# Patient Record
Sex: Male | Born: 1980 | Race: White | Hispanic: No | Marital: Married | Smoking: Never smoker
Health system: Southern US, Community
[De-identification: ages and names within clinical notes are randomized; demographics above are authoritative.]

## PROBLEM LIST (undated history)

## (undated) DIAGNOSIS — I10 Essential (primary) hypertension: Secondary | ICD-10-CM

## (undated) HISTORY — DX: Gilbert syndrome: E80.4

## (undated) HISTORY — DX: Essential (primary) hypertension: I10

---

## 2019-05-09 ENCOUNTER — Ambulatory Visit (INDEPENDENT_AMBULATORY_CARE_PROVIDER_SITE_OTHER): Payer: BC Managed Care – PPO | Admitting: Gastroenterology

## 2019-05-09 ENCOUNTER — Other Ambulatory Visit (INDEPENDENT_AMBULATORY_CARE_PROVIDER_SITE_OTHER): Payer: BC Managed Care – PPO

## 2019-05-09 ENCOUNTER — Other Ambulatory Visit: Payer: Self-pay

## 2019-05-09 ENCOUNTER — Encounter: Payer: Self-pay | Admitting: Gastroenterology

## 2019-05-09 VITALS — BP 130/104 | HR 61 | Temp 98.3°F | Ht 70.5 in | Wt 204.0 lb

## 2019-05-09 DIAGNOSIS — R1900 Intra-abdominal and pelvic swelling, mass and lump, unspecified site: Secondary | ICD-10-CM | POA: Insufficient documentation

## 2019-05-09 DIAGNOSIS — K5909 Other constipation: Secondary | ICD-10-CM | POA: Insufficient documentation

## 2019-05-09 LAB — BASIC METABOLIC PANEL
BUN: 14 mg/dL (ref 6–23)
CO2: 25 mEq/L (ref 19–32)
Calcium: 9.5 mg/dL (ref 8.4–10.5)
Chloride: 101 mEq/L (ref 96–112)
Creatinine, Ser: 1.11 mg/dL (ref 0.40–1.50)
GFR: 73.17 mL/min (ref >60.00)
Glucose, Bld: 91 mg/dL (ref 70–99)
Potassium: 4.2 mEq/L (ref 3.5–5.1)
Sodium: 136 mEq/L (ref 135–145)

## 2019-05-09 NOTE — Progress Notes (Signed)
     05/09/2019 Kenneth Gonzales [id number] [birth year]   HISTORY OF PRESENT ILLNESS: This is a 39 year old male who is new to our office.  He has been referred here by his PCP, Dr. Elnoria Gonzales land, for evaluation regarding a bulge on the left side of his abdomen.  He says that he just happened to notice it one day while riding in the car, it seemed to be bigger than the other side.  He can notice it mostly when he stands and pushes his stomach/abdomen out.  It is not painful.  PCP ordered an abdominal series x-ray, which just showed moderate to large amount of stool in the colon.  He does admit that he has very infrequent bowel movements, sometimes going a few days without a bowel movement.  He has started MiraLAX and that has definitely helped, but the bulge still remains.  Abdominal ultrasound was also performed was unremarkable for any cause of this bulge.  Laboratory studies including CMP and TSH are unremarkable.  Did have a slightly elevated total bilirubin at 1.5, but has history of Kenneth Gonzales bears.   Past Medical History:  Diagnosis Date  . Gilbert syndrome   . Hypertension    History reviewed. No pertinent surgical history.  reports that he has never smoked. He uses smokeless tobacco. He reports current alcohol use. He reports that he does not use drugs. family history includes Leukemia in his paternal uncle. No Known Allergies    Outpatient Encounter Medications as of 05/09/2019  Medication Sig  . lisinopril (ZESTRIL) 20 MG tablet Take 20 mg by mouth daily.  . polyethylene glycol (MIRALAX / GLYCOLAX) 17 g packet Take 17 g by mouth as needed.   No facility-administered encounter medications on file as of 05/09/2019.     REVIEW OF SYSTEMS  : All other systems reviewed and negative except where noted in the History of Present Illness.   PHYSICAL EXAM: BP (!) 130/104   Pulse 61   Temp 98.3 F (36.8 C)   Ht 5' 10.5" (1.791 m)   Wt 204 lb (92.5 kg)   BMI 28.86 kg/m  General: Well developed  white male in no acute distress Head: Normocephalic and atraumatic Eyes:  Sclerae anicteric, conjunctiva pink. Ears: Normal auditory acuity Lungs: Clear throughout to auscultation; no increased WOB. Heart: Regular rate and rhythm; no M/R/G. Abdomen: Soft, non-distended.  BS present.  Non-tender.  No masses noted.  Did note this "bulge"/assymetry on the left side when patient was standing. Musculoskeletal: Symmetrical with no gross deformities  Skin: No lesions on visible extremities Extremities: No edema  Neurological: Alert oriented x 4, grossly non-focal Psychological:  Alert and cooperative. Normal mood and affect  ASSESSMENT AND PLAN: *Left sided abdominal bulge: I really think that this represents asymmetry of his abdominal fat tissue or just the asymmetric distribution of such.  We will plan for CT scan of the abdomen and pelvis with contrast just to be sure that we are not missing anything else (for reassurance and piece of mind), but I did not feel any mass on exam today. *Constipation:  Abdominal x-ray showed a lot of stool in the colon.  Reports infrequent bowel movements, which has definitely improved since using Miralax, but no change in the "bulge".  Will continue Miralax daily long-term.   CC:  Kenneth Gonzales, Kenneth Gonzales,*  =

## 2019-05-09 NOTE — Patient Instructions (Signed)
You have been scheduled for a CT scan of the abdomen and pelvis at [facility] are scheduled on 05/16/19 at 12:30pm. You should arrive 15 minutes prior to your appointment time for registration. Please follow the written instructions below on the day of your exam:  WARNING: IF YOU ARE ALLERGIC TO IODINE/X-RAY DYE, PLEASE NOTIFY RADIOLOGY IMMEDIATELY AT [phone]! YOU WILL BE GIVEN A 13 HOUR PREMEDICATION PREP.  1) Do not eat or drink anything after 8:30am (4 hours prior to your test) 2) You have been given 2 bottles of oral contrast to drink. The solution may taste better if refrigerated, but do NOT add ice or any other liquid to this solution. Shake well before drinking.    Drink 1 bottle of contrast @ 10:30am (2 hours prior to your exam)  Drink 1 bottle of contrast @ 11:30am (1 hour prior to your exam)  You may take any medications as prescribed with a small amount of water, if necessary. If you take any of the following medications: METFORMIN, GLUCOPHAGE, GLUCOVANCE, AVANDAMET, RIOMET, FORTAMET, [place] MET, JANUMET, GLUMETZA or METAGLIP, you MAY be asked to HOLD this medication 48 hours AFTER the exam.  The purpose of you drinking the oral contrast is to aid in the visualization of your intestinal tract. The contrast solution may cause some diarrhea. Depending on your individual set of symptoms, you may also receive an intravenous injection of x-ray contrast/dye. Plan on being at [facility]  for 30 minutes or longer, depending on the type of exam you are having performed.  This test typically takes 30-45 minutes to complete.  If you have any questions regarding your exam or if you need to reschedule, you may call the CT department at [phone] between the hours of 8:00 am and 5:00 pm, Monday-Friday.  ________________________________________________________________________   Your provider has requested that you go to the basement level for lab work before leaving today. Press  "B" on the elevator. The lab is located at the first door on the left as you exit the elevator.  Thank you for choosing me and [facility].  Janett Billow Zehr-PA

## 2019-05-16 ENCOUNTER — Other Ambulatory Visit: Payer: Self-pay

## 2019-05-16 ENCOUNTER — Ambulatory Visit (HOSPITAL_COMMUNITY)
Admission: RE | Admit: 2019-05-16 | Discharge: 2019-05-16 | Disposition: A | Discharge status: Home / Self Care | Payer: BC Managed Care – PPO | Source: Ambulatory Visit | Attending: Gastroenterology | Admitting: Gastroenterology

## 2019-05-16 DIAGNOSIS — R1900 Intra-abdominal and pelvic swelling, mass and lump, unspecified site: Secondary | ICD-10-CM | POA: Diagnosis present

## 2019-05-16 MED ORDER — IOHEXOL 300 MG/ML  SOLN
100.0000 mL | Freq: Once | INTRAMUSCULAR | Status: AC | PRN
  Administered 2019-05-16: 100 mL via INTRAVENOUS

## 2019-05-16 MED ORDER — SODIUM CHLORIDE (PF) 0.9 % IJ SOLN
INTRAMUSCULAR | Status: AC
  Filled 2019-05-16: qty 50

## 2020-12-23 IMAGING — CT CT ABD-PELV W/ CM
2 of 5 series · 16 of 46 positions shown, 18 images · IV contrast (APPLIED)
Comparison: None.

CLINICAL DATA: Left-sided abdominal bulge for 10-12 months, no pain

EXAM:
CT ABDOMEN AND PELVIS WITH CONTRAST
TECHNIQUE: Multidetector CT imaging of the abdomen and pelvis was performed
using the standard protocol following bolus administration of
intravenous contrast.
CONTRAST:  100mL OMNIPAQUE IOHEXOL 300 MG/ML SOLN, additional oral
enteric contrast

[Series 2: axial st · axial · 0.76mm/px · z∈[-510,-70]mm · 13 of 100 slices shown, 15 images]
[im 6/100  soft-tissue]
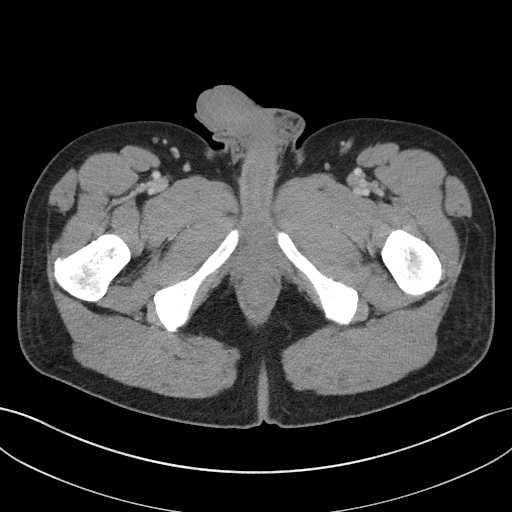
[im 6/100  bone]
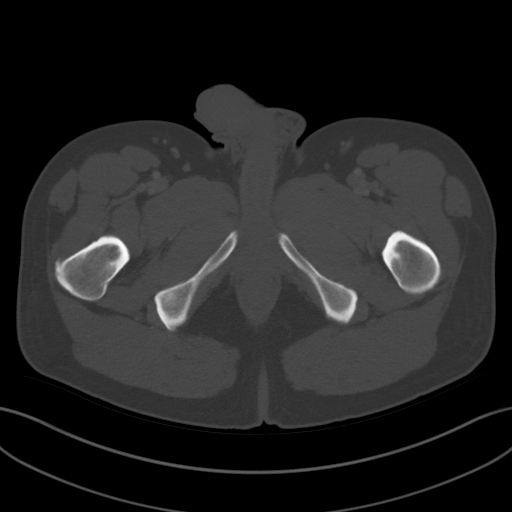
[im 12/100  soft-tissue]
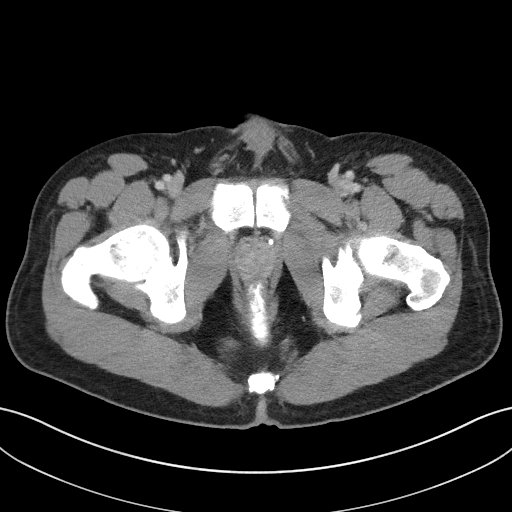
[im 24/100  soft-tissue]
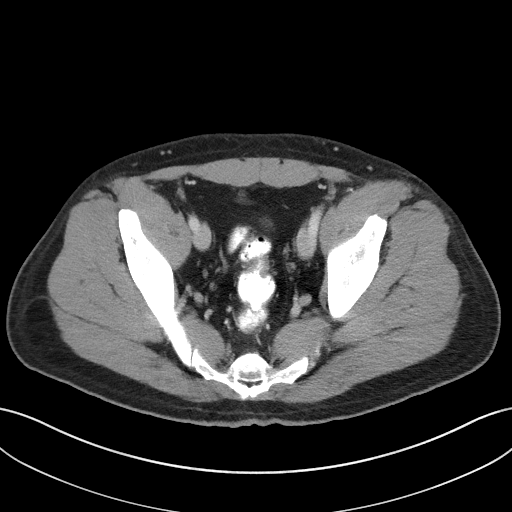
[im 30/100  soft-tissue]
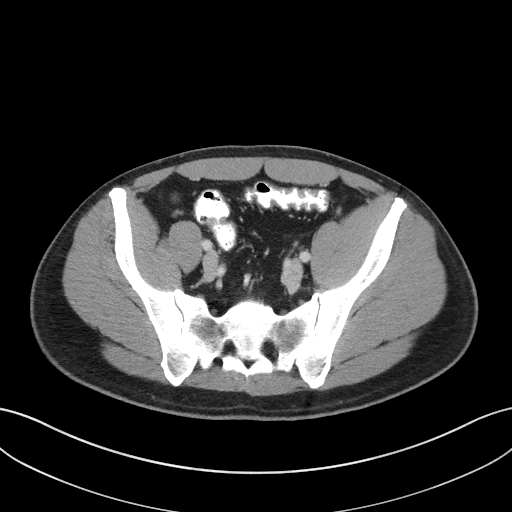
[im 35/100  soft-tissue]
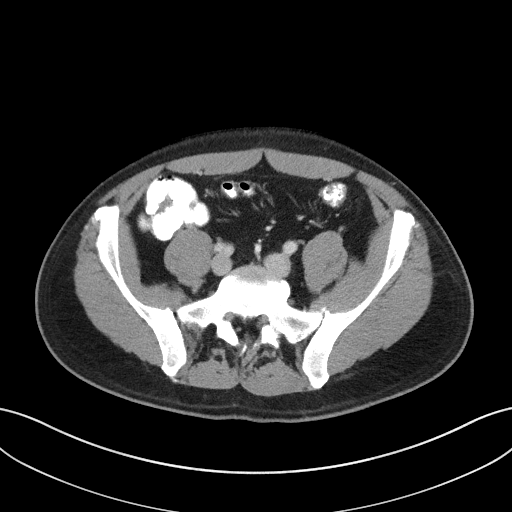
[im 41/100  soft-tissue]
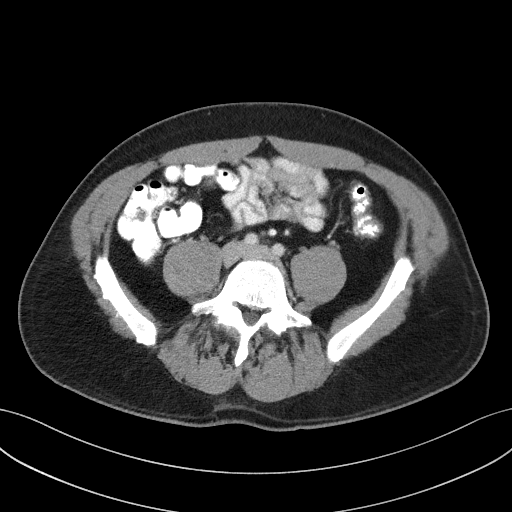
[im 53/100  soft-tissue]
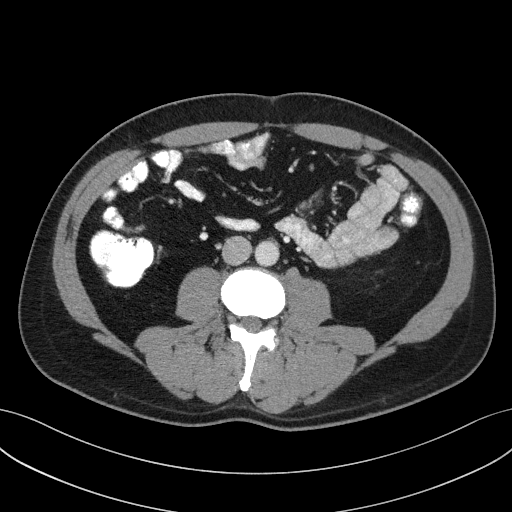
[im 59/100  soft-tissue]
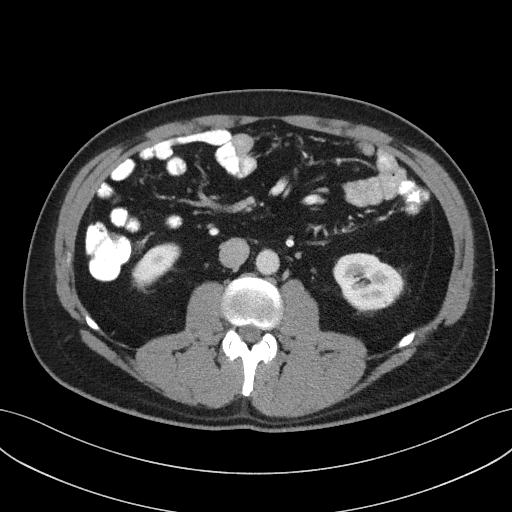
[im 65/100  soft-tissue]
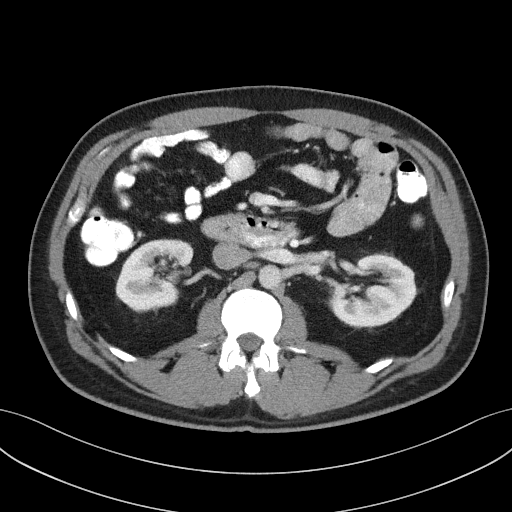
[im 65/100  bone]
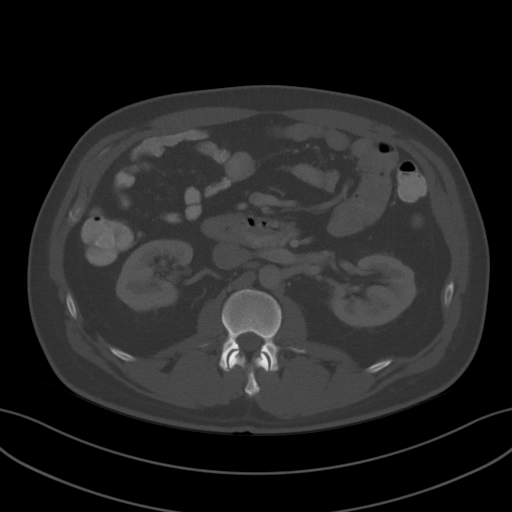
[im 70/100  soft-tissue]
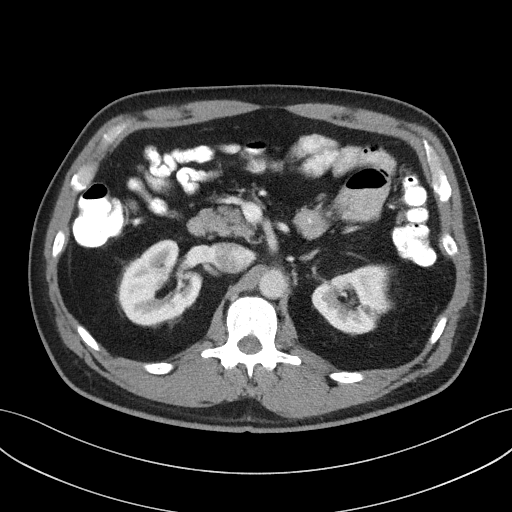
[im 76/100  soft-tissue]
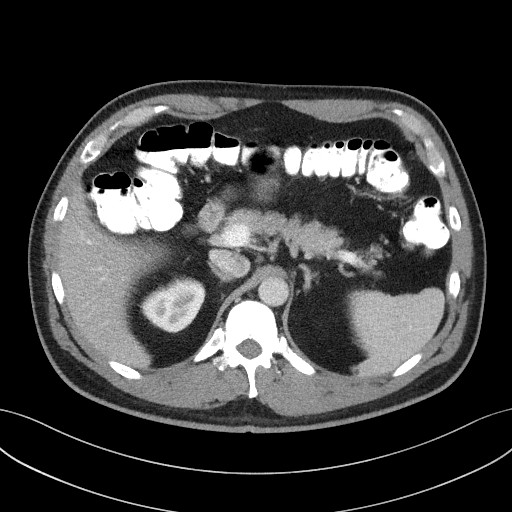
[im 88/100  soft-tissue]
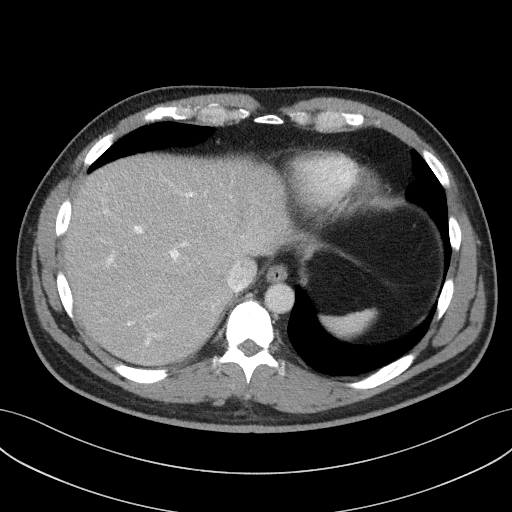
[im 94/100  soft-tissue]
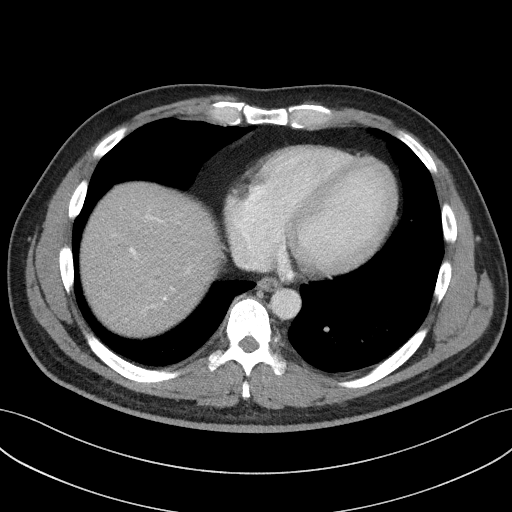

[Series 5: coronal st · coronal · 0.75mm/px · 3 of 89 slices shown]
[im 30/89  soft-tissue]
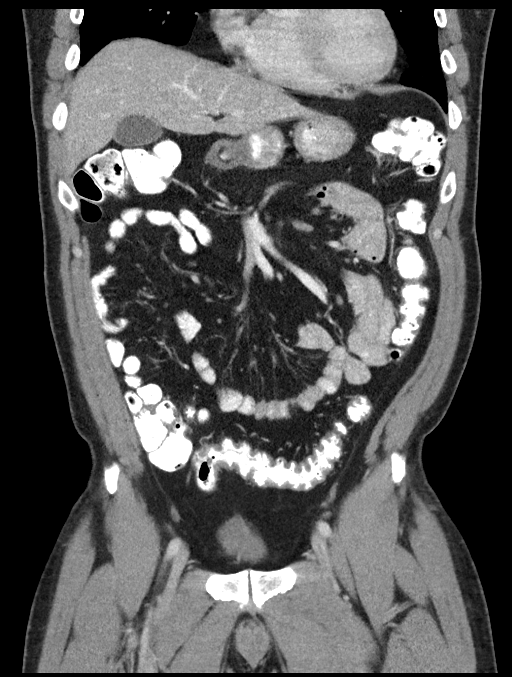
[im 40/89  soft-tissue]
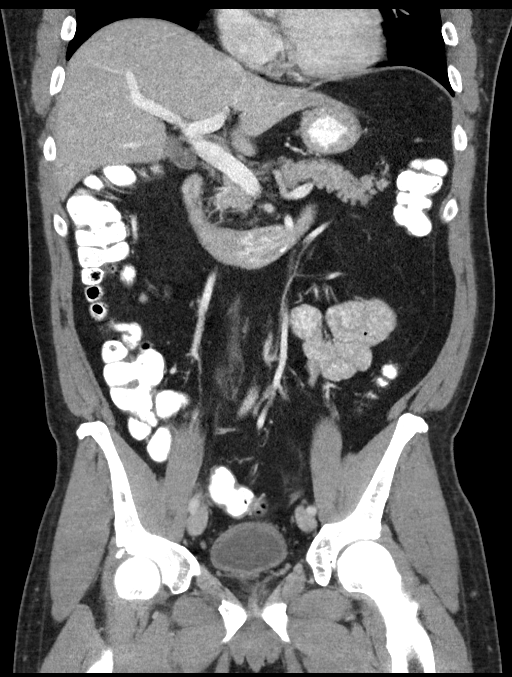
[im 49/89  soft-tissue]
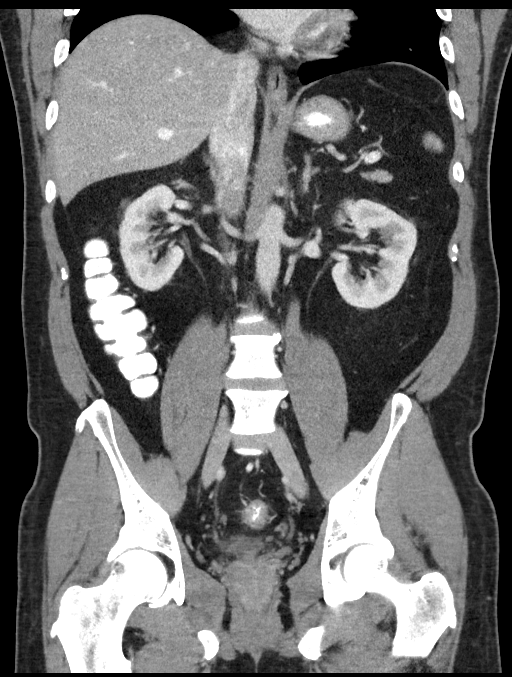

[16 of 46 positions shown; findings below may reference images not displayed]

FINDINGS: Lower chest: No acute abnormality.

Hepatobiliary: No solid liver abnormality is seen. No gallstones,
gallbladder wall thickening, or biliary dilatation.

Pancreas: Unremarkable. No pancreatic ductal dilatation or
surrounding inflammatory changes.

Spleen: Normal in size without significant abnormality.

Adrenals/Urinary Tract: Adrenal glands are unremarkable. Kidneys are
normal, without renal calculi, solid lesion, or hydronephrosis.
Bladder is unremarkable.

Stomach/Bowel: Stomach is within normal limits. Appendix appears
normal. No evidence of bowel wall thickening, distention, or
inflammatory changes.

Vascular/Lymphatic: No significant vascular findings are present. No
enlarged abdominal or pelvic lymph nodes.

Reproductive: No mass or other significant abnormality.

Other: No abdominal wall hernia or other abnormality corresponding
to BB markers applied about the lower left ribs and left hemiabdomen
no abdominopelvic ascites.

Musculoskeletal: No acute or significant osseous findings.
IMPRESSION: 1. No acute CT findings of the abdomen or pelvis.
2. No abdominal wall hernia or other abnormality corresponding to BB
markers applied about the lower left ribs and left hemiabdomen.
